# Patient Record
Sex: Male | Born: 1995 | Race: Black or African American | Hispanic: No | Marital: Single | Smoking: Never smoker
Health system: Southern US, Community
[De-identification: ages and names within clinical notes are randomized; demographics above are authoritative.]

---
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
## 2016-02-19 IMAGING — CR DG WRIST COMPLETE 3+V*R*
4 series · 4 of 4 positions shown · non-contrast
Comparison: None.

CLINICAL DATA: Injured wrist while lifting weights yesterday.
Persistent wrist pain.

EXAM:
RIGHT WRIST - COMPLETE 3+ VIEW

[wrist pa (1 of 2)]
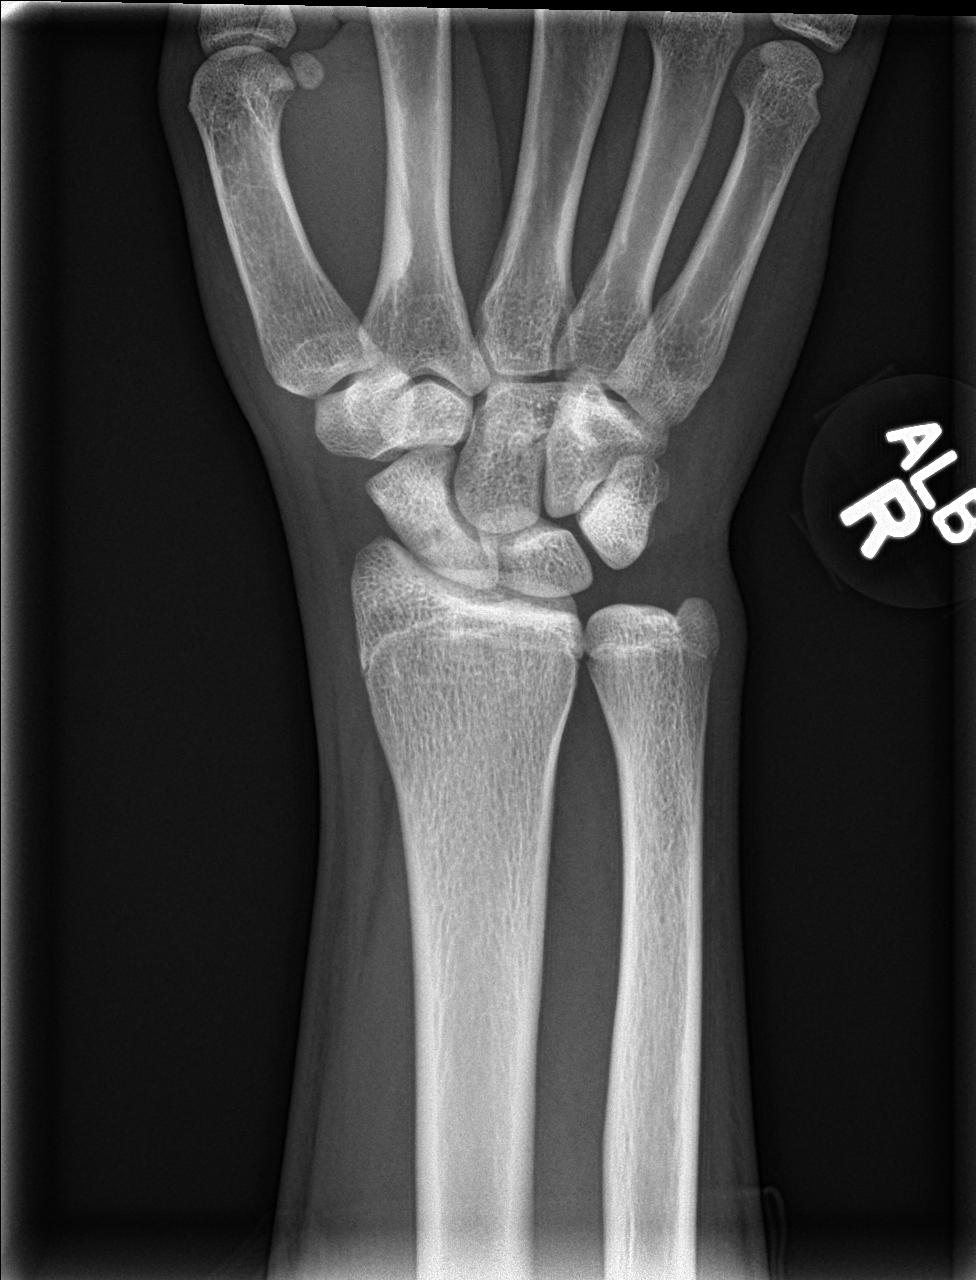

[wrist obl]
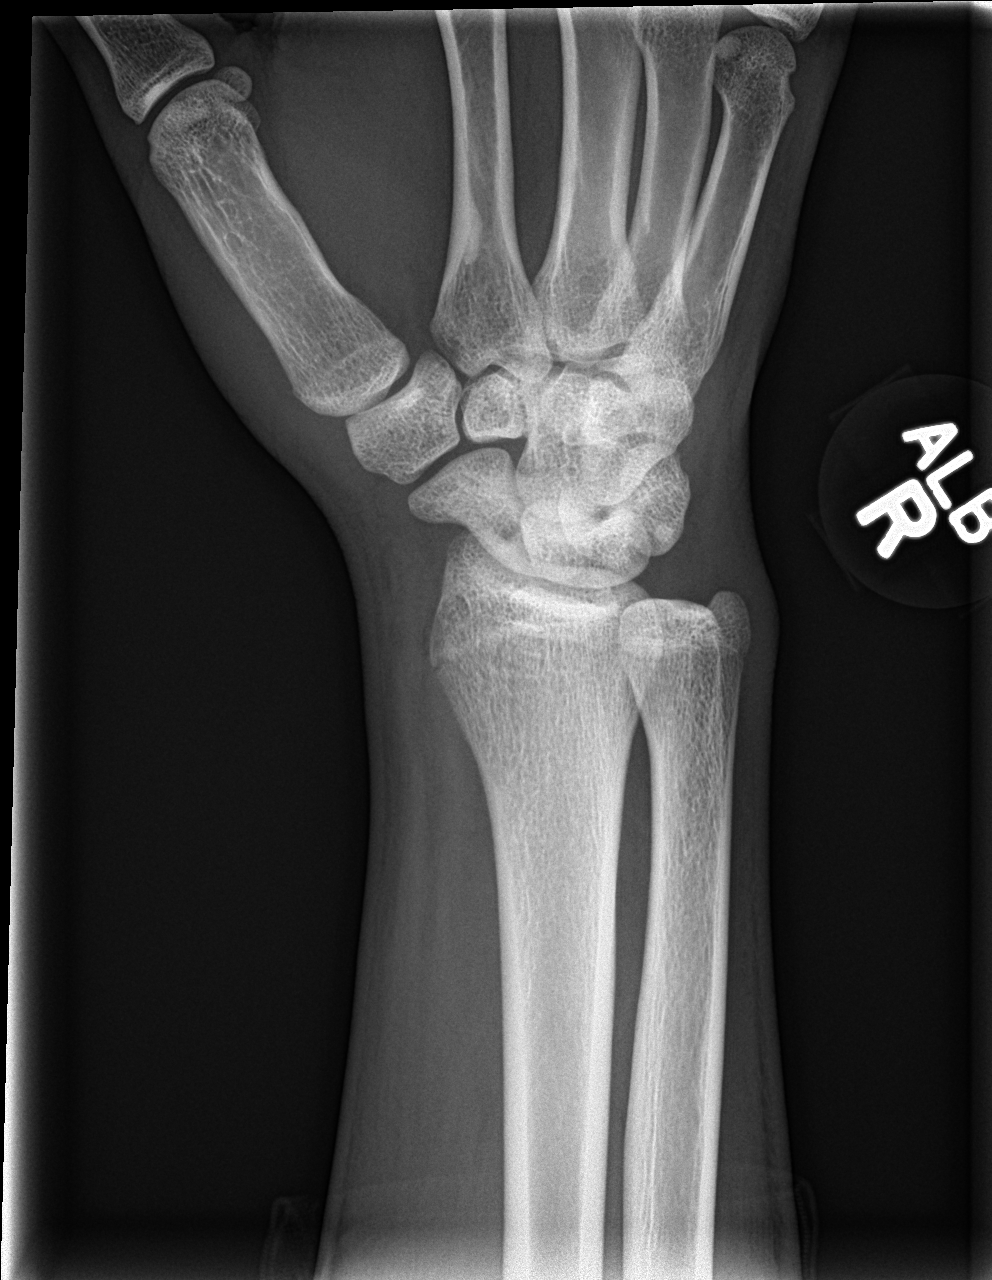

[wrist lat]
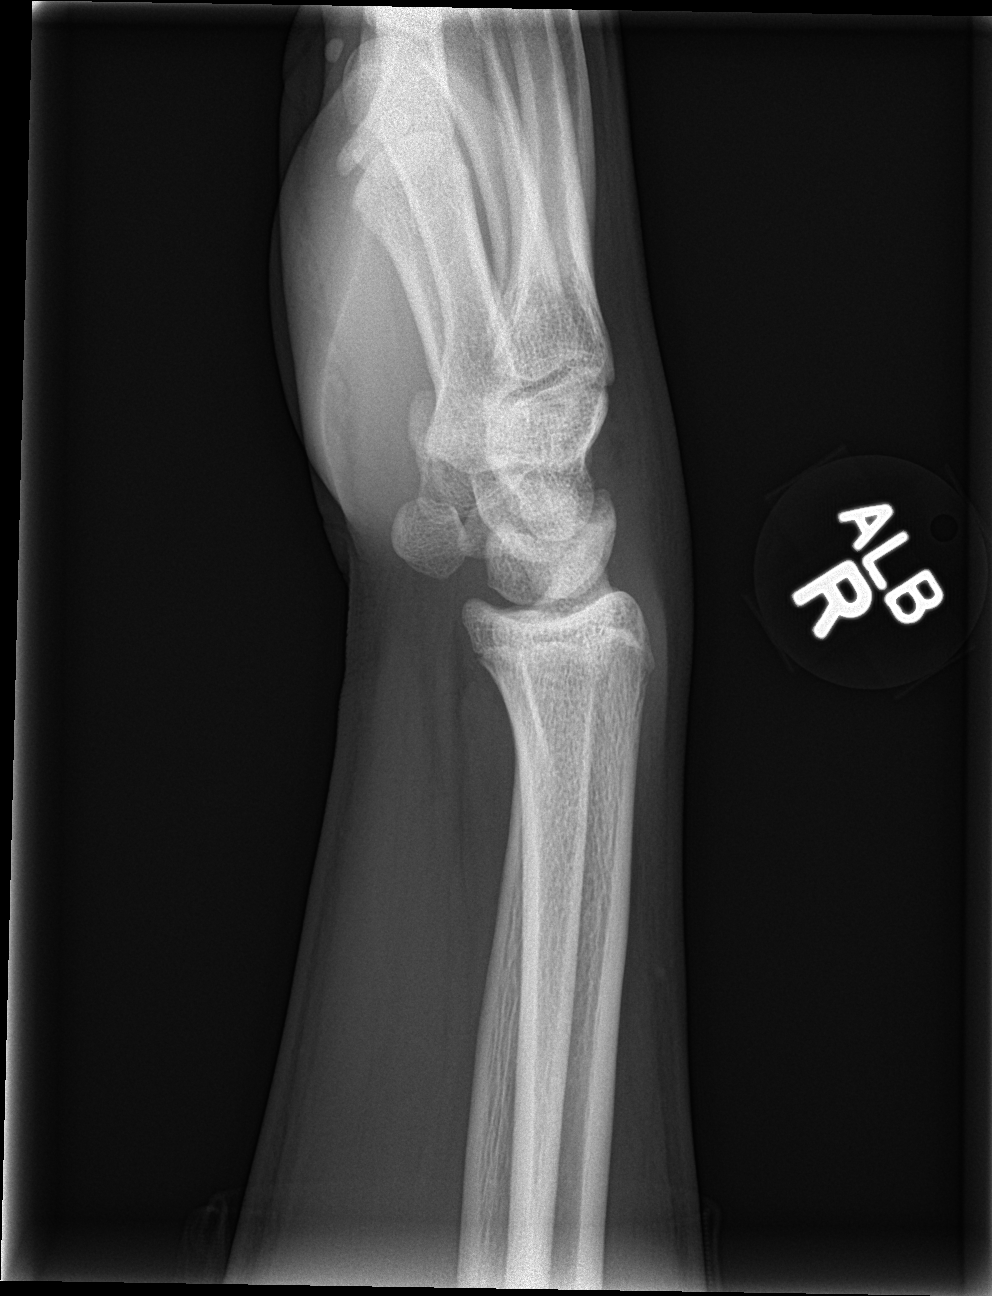

[wrist pa (2 of 2)]
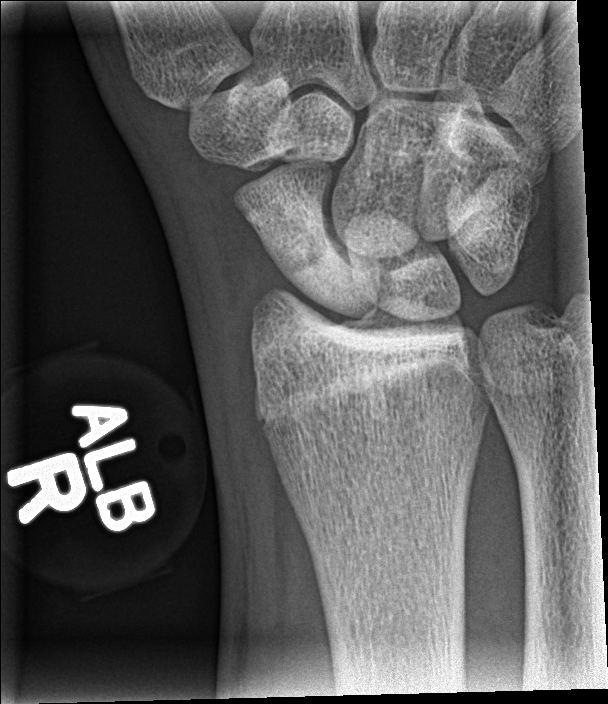

[4 of 4 positions shown; findings below may reference images not displayed]

FINDINGS: The joint spaces are maintained. No acute fractures identified. The
radial and ulnar physis ease are mostly fused. The intercarpal joint
spaces are maintained. Area of cortical thickening and density along
the proximal second metacarpal is most likely benign enostosis or
healed fibrous cortical lesion.

Vague lucency in the waist region of the scaphoid. Suspect a remote
fracture. Diffuse increased density in the navicular bone. Could not
exclude AVN.
IMPRESSION: No acute fracture.

Suspect remote healed navicular waist fracture. Moderate sclerotic
change involving the navicular bone. Could not exclude AVN.
Recommend orthopedic consultation. MR Wideman be helpful for further
evaluation.

Benign enostosis involving the second metacarpal.

## 2016-07-01 ENCOUNTER — Emergency Department
Admission: EM | Admit: 2016-07-01 | Discharge: 2016-07-01 | Disposition: A | Discharge status: Home / Self Care | Payer: Worker's Compensation | Attending: Emergency Medicine | Admitting: Emergency Medicine

## 2016-07-01 ENCOUNTER — Encounter: Payer: Self-pay | Admitting: Emergency Medicine

## 2016-07-01 DIAGNOSIS — Y929 Unspecified place or not applicable: Secondary | ICD-10-CM | POA: Insufficient documentation

## 2016-07-01 DIAGNOSIS — Z23 Encounter for immunization: Secondary | ICD-10-CM | POA: Diagnosis not present

## 2016-07-01 DIAGNOSIS — Y9389 Activity, other specified: Secondary | ICD-10-CM | POA: Diagnosis not present

## 2016-07-01 DIAGNOSIS — Y990 Civilian activity done for income or pay: Secondary | ICD-10-CM | POA: Diagnosis not present

## 2016-07-01 DIAGNOSIS — S6992XA Unspecified injury of left wrist, hand and finger(s), initial encounter: Secondary | ICD-10-CM | POA: Diagnosis present

## 2016-07-01 DIAGNOSIS — W268XXA Contact with other sharp object(s), not elsewhere classified, initial encounter: Secondary | ICD-10-CM | POA: Insufficient documentation

## 2016-07-01 DIAGNOSIS — S61215A Laceration without foreign body of left ring finger without damage to nail, initial encounter: Secondary | ICD-10-CM | POA: Diagnosis not present

## 2016-07-01 MED ORDER — TETANUS-DIPHTH-ACELL PERTUSSIS 5-2.5-18.5 LF-MCG/0.5 IM SUSP
INTRAMUSCULAR | Status: AC
  Administered 2016-07-01: 0.5 mL via INTRAMUSCULAR
  Filled 2016-07-01: qty 0.5

## 2016-07-01 MED ORDER — TETANUS-DIPHTH-ACELL PERTUSSIS 5-2.5-18.5 LF-MCG/0.5 IM SUSP
0.5000 mL | Freq: Once | INTRAMUSCULAR | Status: AC
  Administered 2016-07-01: 0.5 mL via INTRAMUSCULAR

## 2016-07-01 NOTE — ED Provider Notes (Signed)
[facility] Emergency Department Provider Note   First MD Initiated Contact with Patient 07/01/16 [phone]     (approximate)  I have reviewed the triage vital signs and the nursing notes.   HISTORY  Chief Complaint Laceration   HPI Blake Thomas is a 21 y.o. 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 Patient states while working on machinery accidentally sustained a laceration to his left ring finger. Patient denies any pain at this time.   No past medical history on file.  There are no active problems to display for this patient.   No past surgical history on file.  Prior to Admission medications   Medication Sig Start Date End Date Taking? Authorizing Provider  meloxicam (MOBIC) 15 MG tablet Take 1 tablet (15 mg total) by mouth daily as needed for pain. 03/08/15   Renford Dills, NP    Allergies No known drug allergies  Family History  Problem Relation Age of Onset  . Diabetes Paternal Grandmother   . Heart disease Paternal Grandmother     Social History Social History  Substance Use Topics  . Smoking status: Never Smoker  . Smokeless tobacco: Not on file  . Alcohol use No    Review of Systems Constitutional: No fever/chills Eyes: No visual changes. ENT: No sore throat. Cardiovascular: Denies chest pain. Respiratory: Denies shortness of breath. Gastrointestinal: No abdominal pain.  No nausea, no vomiting.  No diarrhea.  No constipation. Genitourinary: Negative for dysuria. Musculoskeletal: Negative for back pain. Integumentary: Negative for rash.Positive for left ring finger laceration Neurological: Negative for headaches, focal weakness or numbness.   ____________________________________________   PHYSICAL EXAM:  VITAL SIGNS: ED Triage Vitals  Enc Vitals Group     BP 07/01/16 0244 128/80     Pulse Rate 07/01/16 0244 (!) 52     Resp 07/01/16 0244 20    Temp 07/01/16 0244 98 F (36.7 C)     Temp Source 07/01/16 0244 Oral     SpO2 07/01/16 0244 97 %     Weight 07/01/16 0244 158 lb (71.7 kg)     Height 07/01/16 0244 6' (1.829 m)     Head Circumference --      Peak Flow --      Pain Score 07/01/16 0350 0     Pain Loc --      Pain Edu? --      Excl. in GC? --     Constitutional: Alert and oriented. Well appearing and in no acute distress. Head: Atraumatic. Mouth/Throat: Mucous membranes are moist.  Oropharynx non-erythematous. Neck: No stridor.  Musculoskeletal: No lower extremity tenderness nor edema. No gross deformities of extremities. Neurologic:  Normal speech and language. No gross focal neurologic deficits are appreciated.  Skin:  Skin is warm, 1/2 inch superficial linear laceration left ring finger. No active bleeding at this time. Psychiatric: Mood and affect are normal. Speech and behavior are normal.     ..Laceration Repair Date/Time: 07/01/2016 10:45 PM Performed by: Darci Current Authorized by: Darci Current   Consent:    Consent obtained:  Verbal   Consent given by:  Patient   Risks discussed:  Infection, pain and poor cosmetic result   Alternatives discussed:  No treatment Anesthesia (see MAR for exact dosages):    Anesthesia method:  None Laceration details:    Location:  Finger   Finger location:  L ring finger   Length (cm):  2   Depth (mm):  1 Repair type:    Repair type:  Simple Treatment:    Area cleansed with:  Betadine   Amount of cleaning:  Standard Skin repair:    Repair method:  Tissue adhesive Approximation:    Approximation:  Close Post-procedure details:    Dressing:  Open (no dressing)     ____________________________________________   INITIAL IMPRESSION / ASSESSMENT AND PLAN / ED COURSE  Pertinent labs & imaging results that were available during my care of the patient were reviewed by me and considered in my medical decision making (see chart for details).         ____________________________________________  FINAL CLINICAL IMPRESSION(S) / ED DIAGNOSES  Final diagnoses:  Laceration of left ring finger without foreign body without damage to nail, initial encounter     MEDICATIONS GIVEN DURING THIS VISIT:  Medications  Tdap (BOOSTRIX) injection 0.5 mL (0.5 mLs Intramuscular Given 07/01/16 0401)     NEW OUTPATIENT MEDICATIONS STARTED DURING THIS VISIT:  Discharge Medication List as of 07/01/2016  3:33 AM      Discharge Medication List as of 07/01/2016  3:33 AM      Discharge Medication List as of 07/01/2016  3:33 AM       Note:  This document was prepared using Dragon voice recognition software and may include unintentional dictation errors.    Darci Current, MD 07/01/16 [phone]

## 2016-07-01 NOTE — ED Triage Notes (Addendum)
Patient ambulatory to triage with steady gait, without difficulty or distress noted; pt employeed with [employer] (workers comp profile indicates UDS required only); pt st cut left 4th finger while at work PTA; approx 1/2" lac noted with small amount bleeding

## 2016-07-01 NOTE — ED Notes (Signed)
Pt voices good understanding of UDS to be performed and consent signed; pt completed medication history form and signed. Allayne Stack, EDT, to triage to comple UDS using chain of custody

## 2019-03-12 ENCOUNTER — Other Ambulatory Visit: Payer: Self-pay

## 2019-03-12 ENCOUNTER — Encounter: Payer: Self-pay | Admitting: Emergency Medicine

## 2019-03-12 ENCOUNTER — Emergency Department
Admission: EM | Admit: 2019-03-12 | Discharge: 2019-03-12 | Disposition: A | Discharge status: Home / Self Care | Payer: Self-pay | Attending: Emergency Medicine | Admitting: Emergency Medicine

## 2019-03-12 DIAGNOSIS — J020 Streptococcal pharyngitis: Secondary | ICD-10-CM

## 2019-03-12 DIAGNOSIS — Z79899 Other long term (current) drug therapy: Secondary | ICD-10-CM | POA: Insufficient documentation

## 2019-03-12 DIAGNOSIS — Z20822 Contact with and (suspected) exposure to covid-19: Secondary | ICD-10-CM | POA: Insufficient documentation

## 2019-03-12 DIAGNOSIS — J029 Acute pharyngitis, unspecified: Secondary | ICD-10-CM | POA: Insufficient documentation

## 2019-03-12 MED ORDER — AMOXICILLIN-POT CLAVULANATE 875-125 MG PO TABS
1.0000 | ORAL_TABLET | Freq: Once | ORAL | Status: AC
  Administered 2019-03-12: 1 via ORAL
  Filled 2019-03-12: qty 1

## 2019-03-12 MED ORDER — AMOXICILLIN-POT CLAVULANATE 875-125 MG PO TABS: 1.0000 | ORAL_TABLET | Freq: Two times a day (BID) | ORAL | 0 refills | Status: AC

## 2019-03-12 NOTE — ED Provider Notes (Signed)
[facility] EMERGENCY DEPARTMENT Provider Note   CSN: [id number] Arrival date & time: 03/12/19  1815     History Chief Complaint  Patient presents with  . Sore Throat    Blake Thomas is a 24 y.o. male presents to the emergency department for evaluation of sore throat.  Patient says sore throat and fever since yesterday.  No chest pain, shortness of breath, cough.  No runny nose or nasal congestion.  He complains of some chills and body aches.  No known exposure to strep or Covid.  Tolerating p.o. well.  HPI     History reviewed. No pertinent past medical history.  There are no problems to display for this patient.   History reviewed. No pertinent surgical history.     Family History  Problem Relation Age of Onset  . Diabetes Paternal Grandmother   . Heart disease Paternal Grandmother     Social History   Tobacco Use  . Smoking status: Never Smoker  . Smokeless tobacco: Never Used  Substance Use Topics  . Alcohol use: No    Alcohol/week: 0.0 standard drinks  . Drug use: No    Home Medications Prior to Admission medications   Medication Sig Start Date End Date Taking? Authorizing Provider  amoxicillin-clavulanate (AUGMENTIN) 875-125 MG tablet Take 1 tablet by mouth every 12 (twelve) hours for 10 days. 03/12/19 03/22/19  Evon Slack, PA-C  meloxicam (MOBIC) 15 MG tablet Take 1 tablet (15 mg total) by mouth daily as needed for pain. 03/08/15   Renford Dills, NP    Allergies    Patient has no known allergies.  Review of Systems   Review of Systems  Constitutional: Positive for chills and fever.  HENT: Positive for sore throat. Negative for trouble swallowing and voice change.   Respiratory: Negative for cough.   Gastrointestinal: Negative for abdominal pain, nausea and vomiting.  Skin: Negative for rash.    Physical Exam Updated Vital Signs BP (!) 155/87   Pulse 94   Temp 99.7 F (37.6 C) (Oral)   Resp 16   Wt 65.8 kg    SpO2 99%   BMI 19.67 kg/m   Physical Exam Constitutional:      General: He is not in acute distress.    Appearance: He is well-developed.  HENT:     Head: Normocephalic and atraumatic.     Jaw: No trismus.     Right Ear: Hearing, tympanic membrane, ear canal and external ear normal.     Left Ear: Hearing, tympanic membrane, ear canal and external ear normal.     Nose: Rhinorrhea present.     Right Sinus: No maxillary sinus tenderness or frontal sinus tenderness.     Left Sinus: No maxillary sinus tenderness or frontal sinus tenderness.     Mouth/Throat:     Mouth: Mucous membranes are moist.     Pharynx: Uvula midline. Posterior oropharyngeal erythema present. No pharyngeal swelling, oropharyngeal exudate or uvula swelling.     Tonsils: Tonsillar exudate present. No tonsillar abscesses.  Eyes:     Conjunctiva/sclera: Conjunctivae normal.  Cardiovascular:     Rate and Rhythm: Normal rate and regular rhythm.     Heart sounds: Normal heart sounds.  Pulmonary:     Effort: Pulmonary effort is normal. No respiratory distress.     Breath sounds: Normal breath sounds. No stridor. No wheezing.  Chest:     Chest wall: No tenderness.  Abdominal:     General: There is  no distension.     Palpations: Abdomen is soft.     Tenderness: There is no abdominal tenderness. There is no guarding.  Musculoskeletal:        General: Normal range of motion.     Cervical back: Normal range of motion.  Lymphadenopathy:     Cervical: Cervical adenopathy present.  Skin:    General: Skin is warm and dry.     Findings: No rash.  Neurological:     Mental Status: He is alert and oriented to person, place, and time.  Psychiatric:        Behavior: Behavior normal.        Thought Content: Thought content normal.        Judgment: Judgment normal.     ED Results / Procedures / Treatments   Labs (all labs ordered are listed, but only abnormal results are displayed) Labs Reviewed - No data to  display  EKG None  Radiology No results found.  Procedures Procedures (including critical care time)  Medications Ordered in ED Medications  amoxicillin-clavulanate (AUGMENTIN) 875-125 MG per tablet 1 tablet (has no administration in time range)    ED Course  I have reviewed the triage vital signs and the nursing notes.  Pertinent labs & imaging results that were available during my care of the patient were reviewed by me and considered in my medical decision making (see chart for details).    MDM Rules/Calculators/A&P                      24 year old male with sore throat, fever, chills and body aches.  He has pharyngeal erythema, slight exudates with no sign of peritonsillar abscess.  He has tender anterior cervical lymphadenopathy with lack of cough and other viral symptoms.  Patient treated for strep.  He is requesting Covid test, test ordered.  He understands signs and symptoms return to ED for.  Tolerating p.o. well. Final Clinical Impression(s) / ED Diagnoses Final diagnoses:  Strep pharyngitis    Rx / DC Orders ED Discharge Orders         Ordered    amoxicillin-clavulanate (AUGMENTIN) 875-125 MG tablet  Every 12 hours     03/12/19 1839           Renata Caprice 03/12/19 1844    Lavonia Drafts, MD 03/12/19 2203

## 2019-03-12 NOTE — ED Triage Notes (Addendum)
Here for sore throat since yesterday. Reports temp earlier 102..  Ambulatory. NAD. No dyspnea noted. Handling secretions.  No other symptoms.  No covid exposure that pt knows of.  Tonsils enlarged and red.

## 2019-03-12 NOTE — ED Notes (Signed)
See triage note. Throat appears red. Pt in NAD at this time

## 2019-03-12 NOTE — Discharge Instructions (Addendum)
Please make sure you are drinking lots of fluids.  Alternate Tylenol and ibuprofen as needed for pain.  Complete 10-day course of antibiotics.  Return to the ER for any difficulty swallowing, inability to take your medications, fevers above 102 that are not going down with Tylenol and or ibuprofen, worsening symptoms or urgent changes in your health.

## 2019-03-12 NOTE — ED Notes (Signed)
Provider at bedside

## 2019-03-13 ENCOUNTER — Telehealth: Payer: Self-pay

## 2019-03-13 LAB — SARS CORONAVIRUS 2 (TAT 6-24 HRS): SARS Coronavirus 2: NEGATIVE

## 2019-03-13 NOTE — Telephone Encounter (Signed)
Pt notified of negative COVID-19 results. Understanding verbalized.  Blake Thomas   

## 2023-12-10 ENCOUNTER — Telehealth: Payer: Self-pay | Admitting: Family Medicine

## 2023-12-10 DIAGNOSIS — R112 Nausea with vomiting, unspecified: Secondary | ICD-10-CM | POA: Diagnosis not present

## 2023-12-10 MED ORDER — ONDANSETRON 4 MG PO TBDP: 4.0000 mg | ORAL_TABLET | Freq: Three times a day (TID) | ORAL | 0 refills | Status: DC | PRN

## 2023-12-10 NOTE — Progress Notes (Signed)

## 2024-02-18 ENCOUNTER — Telehealth: Admitting: Physician Assistant

## 2024-02-18 ENCOUNTER — Encounter

## 2024-02-18 DIAGNOSIS — R112 Nausea with vomiting, unspecified: Secondary | ICD-10-CM | POA: Diagnosis not present

## 2024-02-18 MED ORDER — ONDANSETRON 4 MG PO TBDP: 4.0000 mg | ORAL_TABLET | Freq: Three times a day (TID) | ORAL | 0 refills | Status: AC | PRN

## 2024-02-18 NOTE — Progress Notes (Signed)

## 2024-04-01 ENCOUNTER — Telehealth: Payer: Self-pay | Admitting: Physician Assistant

## 2024-04-01 DIAGNOSIS — J029 Acute pharyngitis, unspecified: Secondary | ICD-10-CM

## 2024-04-01 NOTE — Progress Notes (Signed)
 We are sorry that you are not feeling well.  Here is how we plan to help!  Your symptoms indicate a likely viral infection (Pharyngitis).   Pharyngitis is inflammation in the back of the throat which can cause a sore throat, scratchiness and sometimes difficulty swallowing.   Pharyngitis is typically caused by a respiratory virus and will just run its course.  Please keep in mind that your symptoms could last up to 10 days.    For throat pain, we recommend over the counter oral pain relief medications such as acetaminophen or aspirin, or anti-inflammatory medications such as ibuprofen or naproxen sodium.    Topical treatments such as oral throat lozenges or sprays may be used as needed.    Avoid close contact with loved ones, especially the very young and elderly.  Remember to wash your hands thoroughly throughout the day as this is the number one way to prevent the spread of infection! We also recommend that you periodically wipe down door knobs and counters with disinfectant.  After careful review of your answers, I would not recommend and antibiotic for your condition.  Antibiotics should not be used to treat conditions that we suspect are caused by viruses like the virus that causes the common cold or flu.  Providers prescribe antibiotics to treat infections caused by bacteria. Antibiotics are very powerful in treating bacterial infections when they are used properly.  To maintain their effectiveness, they should be used only when necessary.  Overuse of antibiotics has resulted in the development of super bugs that are resistant to treatment!    Some people with strep throat, however, can have atypical symptoms. As such, if anything continued to progress despite treatment recommendations, you may need formal testing in clinic or office.  Home Care: Only take medications as instructed by your medical team. Do not drink alcohol while taking these medications. A steam or ultrasonic humidifier can  help congestion.  You can place a towel over your head and breathe in the steam from hot water coming from a faucet. Avoid close contacts especially the very young and the elderly. Cover your mouth when you cough or sneeze. Always remember to wash your hands.  Get Help Right Away If: You develop worsening fever or throat pain. You develop a severe head ache or visual changes. Your symptoms persist after you have completed your treatment plan.  Make sure you Understand these instructions. Will watch your condition. Will get help right away if you are not doing well or get worse.  Your e-visit answers were reviewed by a board certified advanced clinical practitioner to complete your personal care plan.  Depending on the condition, your plan could have included both over the counter or prescription medications.  If there is a problem please reply once you have received a response from your provider.  Your safety is important to us .  If you have drug allergies check your prescription carefully.    You can use MyChart to ask questions about todays visit, request a non-urgent call back, or ask for a work or school excuse for 24 hours related to this e-Visit. If it has been greater than 24 hours you will need to follow up with your provider, or enter a new e-Visit to address those concerns.  You will get an e-mail in the next two days asking about your experience.  I hope that your e-visit has been valuable and will speed your recovery. Thank you for using e-visits.   I have  spent 5 minutes in review of e-visit questionnaire, review and updating patient chart, medical decision making and response to patient.   Callan Norden, PA-C

## 2024-04-01 NOTE — Progress Notes (Signed)
 Message sent to patient, awaiting response.
